# Patient Record
Sex: Male | Born: 1990 | Race: Black or African American | Hispanic: No | Marital: Single | Smoking: Current every day smoker
Health system: Southern US, Community
[De-identification: ages and names within clinical notes are randomized; demographics above are authoritative.]

## PROBLEM LIST (undated history)

## (undated) HISTORY — PX: APPENDECTOMY: SHX54

---
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
## 2015-01-01 ENCOUNTER — Encounter (HOSPITAL_COMMUNITY): Payer: Self-pay | Admitting: Emergency Medicine

## 2015-01-01 ENCOUNTER — Emergency Department (HOSPITAL_COMMUNITY)
Admission: EM | Admit: 2015-01-01 | Discharge: 2015-01-01 | Disposition: A | Discharge status: Home / Self Care | Payer: Self-pay | Attending: Emergency Medicine | Admitting: Emergency Medicine

## 2015-01-01 ENCOUNTER — Emergency Department (HOSPITAL_COMMUNITY)
Admission: EM | Admit: 2015-01-01 | Discharge: 2015-01-01 | Disposition: A | Discharge status: Home / Self Care | Payer: Medicaid Other

## 2015-01-01 DIAGNOSIS — K029 Dental caries, unspecified: Secondary | ICD-10-CM | POA: Insufficient documentation

## 2015-01-01 DIAGNOSIS — K0889 Other specified disorders of teeth and supporting structures: Secondary | ICD-10-CM

## 2015-01-01 DIAGNOSIS — L299 Pruritus, unspecified: Secondary | ICD-10-CM

## 2015-01-01 MED ORDER — PENICILLIN V POTASSIUM 250 MG PO TABS: 250.0000 mg | ORAL_TABLET | Freq: Four times a day (QID) | ORAL | Status: AC

## 2015-01-01 NOTE — ED Notes (Signed)
Pt. reports left upper molar pain and generalized skin itching onset this week , denies SOB / no fever or oral swelling.

## 2015-01-01 NOTE — ED Provider Notes (Signed)
CSN: [id number]     Arrival date & time 01/01/15  2310 History   First MD Initiated Contact with Patient 01/01/15 2316     Chief Complaint  Patient presents with  . Dental Pain     (Consider location/radiation/quality/duration/timing/severity/associated sxs/prior Treatment) HPI Comments: Pt comes in with c/o left upper dental pain. Denies swelling or problems breathing. No fever. States that he has had problems with his teeth in the past. He states that he has also been very itchy for the last couple of days. Denies any rash. He is unsure of any new exposures  The history is provided by the patient. No language interpreter was used.    History reviewed. No pertinent past medical history. Past Surgical History  Procedure Laterality Date  . Appendectomy     No family history on file. Social History  Substance Use Topics  . Smoking status: Never Smoker   . Smokeless tobacco: None  . Alcohol Use: No    Review of Systems  All other systems reviewed and are negative.     Allergies  Review of patient's allergies indicates no known allergies.  Home Medications   Prior to Admission medications   Medication Sig Start Date End Date Taking? Authorizing Provider  penicillin v potassium (VEETID) 250 MG tablet Take 1 tablet (250 mg total) by mouth 4 (four) times daily. 01/01/15 01/08/15  Teressa LowerVrinda Bianey Tesoro, NP   BP 123/86 mmHg  Pulse 94  Temp(Src) 98 F (36.7 C) (Oral)  Resp 16  Ht 5\' 11"  (1.803 m)  Wt 128 lb (58.06 kg)  BMI 17.86 kg/m2  SpO2 96% Physical Exam  Constitutional: He is oriented to person, place, and time. He appears well-developed and well-nourished.  HENT:  Head: Normocephalic and atraumatic.  Right Ear: External ear normal.  Left Ear: External ear normal.  Decay noted to multiple left upper teeth. No swelling noted  Cardiovascular: Normal rate and regular rhythm.   Pulmonary/Chest: Breath sounds normal.  Musculoskeletal: Normal range of motion.   Neurological: He is alert and oriented to person, place, and time. Coordination normal.  Skin: Skin is warm and dry. No rash noted. No erythema.  Nursing note and vitals reviewed.   ED Course  Procedures (including critical care time) Labs Review Labs Reviewed - No data to display  Imaging Review No results found. I have personally reviewed and evaluated these images and lab results as part of my medical decision-making.   EKG Interpretation None      MDM   Final diagnoses:  Toothache  Itching    No sign of ludwigs angina. Pt placed on pcn. Discussed follow up with the dentist   Teressa LowerVrinda Kierah Goatley, NP 01/01/15 2331  Layla MawKristen N Ward, DO 01/02/15 [id number]

## 2015-01-01 NOTE — ED Notes (Signed)
Pt. reports left upper molar pain with generalized skin itching onset this week , respirations unlabored , no oral swelling .

## 2015-01-01 NOTE — Discharge Instructions (Signed)
Pruritus Pruritus is an itching feeling. There are many different conditions and factors that can make your skin itchy. Dry skin is one of the most common causes of itching. Most cases of itching do not require medical attention. Itchy skin can turn into a rash.  HOME CARE INSTRUCTIONS  Watch your pruritus for any changes. Take these steps to help with your condition:  Skin Care  Moisturize your skin as needed. A moisturizer that contains petroleum jelly is best for keeping moisture in your skin.  Take or apply medicines only as directed by your health care provider. This may include:  Corticosteroid cream.  Anti-itch lotions.  Oral anti-histamines.  Apply cool compresses to the affected areas.  Try taking a bath with:  Epsom salts. Follow the instructions on the packaging. You can get these at your local pharmacy or grocery store.  Baking soda. Pour a small amount into the bath as directed by your health care provider.  Colloidal oatmeal. Follow the instructions on the packaging. You can get this at your local pharmacy or grocery store.  Try applying baking soda paste to your skin. Stir water into baking soda until it reaches a paste-like consistency.   Do not scratch your skin.  Avoid hot showers or baths, which can make itching worse. A cold shower may help with itching as long as you use a moisturizer after.  Avoid scented soaps, detergents, and perfumes. Use gentle soaps, detergents, perfumes, and other cosmetic products. General Instructions  Avoid wearing tight clothes.  Keep a journal to help track what causes your itch. Write down:  What you eat.  What cosmetic products you use.  What you drink.  What you wear. This includes jewelry.  Use a humidifier. This keeps the air moist, which helps to prevent dry skin. SEEK MEDICAL CARE IF:  The itching does not go away after several days.  You sweat at night.  You have weight loss.  You are unusually  thirsty.  You urinate more than normal.  You are more tired than normal.  You have abdominal pain.  Your skin tingles.  You feel weak.  Your skin or the whites of your eyes look yellow (jaundice).  Your skin feels numb.   This information is not intended to replace advice given to you by your health care provider. Make sure you discuss any questions you have with your health care provider.   Document Released: 10/11/2010 Document Revised: 06/15/2014 Document Reviewed: 01/25/2014 Elsevier Interactive Patient Education 2016 [employer].  Emergency Department Resource Guide 1) Find a Doctor and Pay Out of Pocket Although you won't have to find out who is covered by your insurance plan, it is a good idea to ask around and get recommendations. You will then need to call the office and see if the doctor you have chosen will accept you as a new patient and what types of options they offer for patients who are self-pay. Some doctors offer discounts or will set up payment plans for their patients who do not have insurance, but you will need to ask so you aren't surprised when you get to your appointment.  2) Contact Your Local Health Department Not all health departments have doctors that can see patients for sick visits, but many do, so it is worth a call to see if yours does. If you don't know where your local health department is, you can check in your phone book. The CDC also has a tool to help you locate  your state's health department, and many state websites also have listings of all of their local health departments.  3) Find a Walk-in Clinic If your illness is not likely to be very severe or complicated, you may want to try a walk in clinic. These are popping up all over the country in pharmacies, drugstores, and shopping centers. They're usually staffed by nurse practitioners or physician assistants that have been trained to treat common illnesses and complaints. They're usually fairly  quick and inexpensive. However, if you have serious medical issues or chronic medical problems, these are probably not your best option.  No Primary Care Doctor: - Call Health Connect at  [phone] - they can help you locate a primary care doctor that  accepts your insurance, provides certain services, etc. - Physician Referral Service- [phone]  Chronic Pain Problems: Organization         Address  Phone   Notes  [facility]  [phone] Patients need to be referred by their primary care doctor.   Medication Assistance: Organization         Address  Phone   Notes  [facility] Medication [facility] [address] [place], [place] [address] [phone] --Must be a resident of [facility] [place] -- Must have NO insurance coverage whatsoever (no Medicaid/ Medicare, etc.) -- The pt. MUST have a primary care doctor that directs their care regularly and follows them in the community   [facility]ist  [phone]   [employer]  [phone]    Agencies that provide inexpensive medical care: Organization         Address  Phone   Notes  [facility]  [phone]   [facility]    [phone]   [facility] [address] [place], [place] [address] [phone]   [facility] [address], [place] [phone]   [facility]    [phone]   [facility]    [phone]   [facility]  [address], [place] Phone:  [phone], Fax:  [phone] Hours of Operation:  9 am - 6 pm, M-F.  Also accepts Medicaid/Medicare and self-pay.  [facility]  [address], [place] Phone: [phone], Fax: [phone]) [phone]. Hours of Operation:  8:30 am - 5:30 pm, M-F.  Also accepts Medicaid and self-pay.  [facility] [place] [address], [place] Phone: [phone]    [facility] [address], [place], [place] [phone], Ext. [phone] Mondays & Thursdays: 7-9 AM.  First 15 patients are seen on a first come, first serve basis.    Medicaid-accepting [facility] Providers:  Organization         Address  Phone   Notes  [facility] [address],  [phone] Also accepts self-pay patients.  [facility] [address] [place], [place]  [phone]   [facility] [address], [place] [phone]) [phone]   [facility] [address] [place] [address], [place] [phone]   Renaye RakersVeita Bland [address], [place]   [phone] Only accepts [place] Access [place] patients after they have their name applied to their card.   Self-Pay (no insurance) in [facility] [place]:  Organization  Address  Phone   Notes  Sickle Cell Patients, [facility] [place] [phone]   [facility] [place] [phone]   [facility] [place]  [address], [place] [phone]   [facility]/Dr. Osei-Bonsu  [address], [place] [address], [place] [phone] Phone number for both [place] and [place] locations is the same.  [facility] [address] [place] [phone]   [facility] [address], [place] or [address] [phone]   [facility] [address] [place] [phone], phone; [phone], fax Sees patients 1st and 3rd Saturday of every month.  Must not qualify for public or private insurance (i.e. Medicaid, Medicare, [place] Health Choice, Veterans' Benefits)  Household income should be no more than 200% of the poverty level The clinic cannot treat you if you are  pregnant or think you are pregnant  Sexually transmitted diseases are not treated at the clinic.    Dental Care: Organization         Address  Phone  Notes  [facility] [place] [phone] Accepts children up to age 56 who are enrolled in [place] or [place]; pregnant women with a Medicaid card; and children who have applied for Medicaid or [place] Health Choice, but were declined, whose parents can pay a reduced fee at time of service.  [facility]  [address], [place] [phone] Accepts children up to age 30 who are enrolled in [place] or [place]; pregnant women with a Medicaid card; and children who have applied for Medicaid or [place] Health Choice, but were declined, whose parents can pay a reduced fee at time of service.  [facility]  [place] [phone] Patients are seen by appointment only. Walk-ins are not accepted. [place] will see patients 32 years of age and older. Monday - Tuesday (8am-5pm) Most Wednesdays (8:30-5pm) $30 per visit, cash only  [facility] Access PRO[facility]  [address], [facility] [phone] Patients are seen by appointment only. Walk-ins are not accepted. [place] will see patients 61 years of age and older. One Wednesday Evening (Monthly: Volunteer Based).  $30 per visit, cash only  [place]  [phone] for adults; Children under age 24, call Graduate Pediatric Dentistry at [phone]. Children aged 39-14, please call [phone] to request a pediatric application.  Dental services are provided in all areas of dental care including fillings, crowns and bridges, complete and partial dentures, implants, gum treatment, root canals, and extractions. Preventive care is also provided. Treatment is provided to  both adults and children. Patients are selected via a lottery and there is often a waiting list.   [facility] [address], [place]  [phone] [email or web address]   [facility] [address] [place], [place] [phone], Ext. [phone] Second and Fourth Thursday of each month, opens at 6:30 AM; Clinic ends at 9 AM.  Patients are seen on a first-come first-served basis, and a limited number are seen during each clinic.   [facility]  [address] [place], [place], [place] [phone]   Eligibility Requirements °You must have lived in [place], [place], or [place] counties for at least the last three months. °  You cannot be eligible for state or federal sponsored healthcare insurance, including Veterans Administration, Medicaid, or Medicare. °  You generally cannot be eligible for healthcare insurance through your employer.  °  How to apply: °Eligibility screenings are held every Tuesday and Wednesday afternoon from 1:00 pm until 4:00 pm. You do not need an appointment for the interview!  °[facility] [address], [place], [place] [phone]   °[facility]  336-342-8273   °[facility]  336-703-3100   °[facility]  336-570-6415   ° °Behavioral Health Resources in the Community: °Intensive Outpatient Programs °Organization         Address  Phone  Notes  °[place] [facility] [address], [place], [place] [phone]   °[facility] [address], [place], [place] [phone]   °[facility] [address], [place], [place] ° 336-882-2125   °[facility] [address],  °[place], [place] 1-800-853-5163 or 336-641-4981   °Substance Abuse Resources °Organization         Address  Phone  Notes  °[facility]  336-882-2125   °[facility]  336-784-9470   °[facility]   336-285-9073   °[facility]  336-845-3988   °Residential & Outpatient Substance Abuse Program  1-800-659-3381   °Psychological Services °Organization         Address  Phone  Notes  °[facility]  336- 832-9600   °[facility]  336- 378-7881   °[facility] [address], [place] 1-800-853-5163 or 336-641-4981   ° °Mobile Crisis Teams °Organization         Address  Phone  Notes  °[facility], Mobile Crisis Care Unit  1-877-626-1772   °Assertive °[facility]otherapeutic Services ° [address] [place], [place] 336-834-9664   °Sharon DeEsch [address] °[place] 336-554-5454   ° °Self-Help/Support Groups °Organization         Address  Phone             Notes  °[facility] [place] - variety of support groups  336- 373-1402 Call for more information  °Narcotics Anonymous (NA), [facility] [address], °[place]  2 meetings at this location  ° °Residential Treatment Programs °Organization         Address  Phone  Notes  °[facility] [address],    °[place]  1-866-801-8205   °[facility] ° [address], [place], [place] [phone]   °[facility] [address], [place] 336-845-3988 Admissions: 8am-3pm M-F  °[facility] [address]    °[place], [place] 336-841-1104   °[facility] [address], [place], [place] 336-379-7146   °[facility] [address]  °[place], [place] 336-285-9073   °[facility] - Intensive Outpatient [address], [place], [place] 336-852-3033   °[facility] ([facility] [address]  °[place], [place] 1-877-615-2722 or 336-784-9470   °[facility] ([facility]) [address] [place], [place] 336-227-7417 Accepts Medicaid  °[facility] [address]  °[place]  1-800-659-3381 Substance Abuse/Addiction Treatment  ° °[place] Behavioral Health Resources °Organization           Address  Phone  Notes  °[facility]  (888) 581-9988   °Julie Brannon, PhD [address] [place], [place]   (336) 349-5553 or (336) 951-0000   ° Behavioral   [address] °[place], [employer] [place] (336) 349-4454   °[facility] [address], [place], [place] (336) 342-8316 Insurance/Medicaid/sponsorship through [facility]  °[facility] [address]                                    [place], [place] (336) 342-8316 Therapy/tele-psych/case  °[facility] [address]  ° [place], [place] (336) 349-2233    °Dr. Arfeen  (336) 349-4544   °[facility] [place]  [employer] [facility] [facility] 1) [address],  °2) [address], [place] °3)  [address], [place] (336) 349-3220 °(336) 342-7768 ° °(336) 342-8140   °[place] Child Abuse Hotline (336) 342-1394 or (336) 342-3537 (After Hours)    ° ° ° ° °

## 2015-01-25 ENCOUNTER — Emergency Department (HOSPITAL_COMMUNITY): Payer: Medicaid Other

## 2015-01-25 ENCOUNTER — Encounter (HOSPITAL_COMMUNITY): Payer: Self-pay | Admitting: Emergency Medicine

## 2015-01-25 ENCOUNTER — Emergency Department (HOSPITAL_COMMUNITY)
Admission: EM | Admit: 2015-01-25 | Discharge: 2015-01-25 | Disposition: A | Discharge status: Home / Self Care | Payer: Medicaid Other | Attending: Emergency Medicine | Admitting: Emergency Medicine

## 2015-01-25 DIAGNOSIS — F17200 Nicotine dependence, unspecified, uncomplicated: Secondary | ICD-10-CM | POA: Insufficient documentation

## 2015-01-25 DIAGNOSIS — R05 Cough: Secondary | ICD-10-CM

## 2015-01-25 DIAGNOSIS — J069 Acute upper respiratory infection, unspecified: Secondary | ICD-10-CM | POA: Insufficient documentation

## 2015-01-25 DIAGNOSIS — R059 Cough, unspecified: Secondary | ICD-10-CM

## 2015-01-25 DIAGNOSIS — Z8611 Personal history of tuberculosis: Secondary | ICD-10-CM | POA: Insufficient documentation

## 2015-01-25 MED ORDER — BENZONATATE 100 MG PO CAPS: 100.0000 mg | ORAL_CAPSULE | Freq: Three times a day (TID) | ORAL | Status: DC

## 2015-01-25 NOTE — ED Provider Notes (Signed)
CSN: [id number]     Arrival date & time 01/25/15  0945 History  This chart was scribed for non-physician practitioner, Ailene Rud, PA-C, working with No att. providers found by Marica Otter, ED Scribe. This patient was seen in room TR06C/TR06C and the patient's care was started at 11:38 AM.   Chief Complaint  Patient presents with  . Cough   The history is provided by the patient. No language interpreter was used.   PCP: No primary care provider on file. HPI Comments: Christian Tanner is a 24 y.o. 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 He states that the cough is gradually improving but he is concerned about lung cancer since he is a daily smoker. He reports he still smokes daily despite his cough. Pt reports taking Theraflu and Nyquil without relief. Pt denies fever, chills recent weight loss, night sweats, headache, congestion, rhinorrhea, sore throat, chest pain, SOB, abdominal pain, n/v, myalgias or any other Sx at this time. He also notes having a positive TB skin test in 2011 for which he was supposed to take 9 months of abx therapy which he did not complete. He was born in [place]  History reviewed. No pertinent past medical history. Past Surgical History  Procedure Laterality Date  . Appendectomy     No family history on file. Social History  Substance Use Topics  . Smoking status: Current Every Day Smoker  . Smokeless tobacco: None  . Alcohol Use: No    Review of Systems  Constitutional: Negative for fever, chills, appetite change, fatigue and unexpected weight change.  HENT: Negative for congestion, ear pain, rhinorrhea, sinus pressure and sore throat.   Eyes: Negative for discharge.  Respiratory: Positive for cough. Negative for shortness of breath and wheezing.   Cardiovascular: Negative for chest pain.   Gastrointestinal: Negative for nausea, vomiting and abdominal pain.  Neurological: Negative for headaches.  All other systems reviewed and are negative.  Allergies  Review of patient's allergies indicates no known allergies.  Home Medications   Prior to Admission medications   Medication Sig Start Date End Date Taking? Authorizing Provider  benzonatate (TESSALON) 100 MG capsule Take 1 capsule (100 mg total) by mouth every 8 (eight) hours. 01/25/15   Shakeeta Godette, PA-C  DiphenhydrAMINE HCl (BENADRYL PO) Take 1 tablet by mouth every 4 (four) hours as needed. For itching    Historical Provider, MD  predniSONE (DELTASONE) 10 MG tablet Take 2 tablets (20 mg total) by mouth 2 (two) times daily. 04/05/11   Marlon Pel, PA-C   Triage Vitals: BP 118/73 mmHg  Pulse 75  Temp(Src) 97.7 F (36.5 C) (Oral)  Resp 16  Ht  (1.778 m)  Wt 195 lb (88.451 kg)  BMI 27.98 kg/m2  SpO2 100% Physical Exam  Constitutional: He appears well-developed and well-nourished. No distress.  HENT:  Head: Normocephalic and atraumatic.  Right Ear: Tympanic membrane, external ear and ear canal normal.  Left Ear: Tympanic membrane, external ear and ear canal normal.  Nose: No mucosal edema or rhinorrhea. Right sinus exhibits no maxillary sinus tenderness and no frontal sinus tenderness. Left sinus exhibits no maxillary sinus tenderness and no frontal sinus tenderness.  Mouth/Throat: Uvula is midline and oropharynx is clear and moist. No oropharyngeal exudate, posterior oropharyngeal edema or posterior oropharyngeal erythema.  Eyes: Conjunctivae and EOM are normal. Right eye exhibits no  discharge. Left eye exhibits no discharge. No scleral icterus.  Neck: Normal range of motion. Neck supple.  Cardiovascular: Normal rate and normal heart sounds.   Pulmonary/Chest: Effort normal and breath sounds normal. No respiratory distress. He has no wheezes. He has no rales.  Breathing unlabored. Lungs CTAB   Musculoskeletal: Normal range of motion.  Moves all extremities spontaneously and walks with a steady gait  Lymphadenopathy:    He has no cervical adenopathy.  Neurological: He is alert. Coordination normal.  Skin: Skin is warm and dry. He is not diaphoretic.  Psychiatric: He has a normal mood and affect. His behavior is normal.  Nursing note and vitals reviewed.   ED Course  Procedures (including critical care time) DIAGNOSTIC STUDIES: Oxygen Saturation is 100% on ra, nl by my interpretation.    COORDINATION OF CARE: 10:41 AM: Discussed treatment plan which includes cough suppressant, PCP referral, with pt at bedside; patient verbalizes understanding and agrees with treatment plan.  MDM   Final diagnoses:  Cough  Upper respiratory infection   Patient presenting with dry cough x 3 weeks. VSS. Pt is nontoxic appearing. No nasal musosal edema noted. TMs pearly gray without erythema or effusion. Oropharynx without erythema, edema or exudate. Lungs CTAB. CXR negative for acute infiltrate. Patients symptoms are consistent with bronchitis, likely viral etiology. Discussed that antibiotics are not indicated for viral infections. Pt will be discharged with symptomatic treatment including tessalon. Also counseled patient on smoking cessation. Discussed patient with infectious disease due to TB history. They recommend follow up with the health department. Discussed this with pt who verbalizes understanding and is agreeable with plan. Pt is hemodynamically stable & in NAD prior to dc.  I personally performed the services described in this documentation, which was scribed in my presence. The recorded information has been reviewed and is accurate.    Alveta HeimlichStevi Kameryn Tisdel, PA-C 01/25/15 1156  Tilden FossaElizabeth Rees, MD 01/26/15 [id number]

## 2015-01-25 NOTE — ED Notes (Signed)
COUGH X 4 WEEKS. DESCRIBES AS 'DRY'. STATES HAD POS TINE TEST IN 2011, WAS PLACED ON MEDS, TOOK FOR 1 MONTH THEN HAD TO GO OUT OF THE COUN[place]. NEVER HAD F/U.

## 2015-01-25 NOTE — Discharge Instructions (Signed)
Call the health department to schedule an appointment for a TB follow up. Continue taking OTC cold and flu medications for your symptoms. Tessalon will help with your cough.    Cough, Adult Coughing is a reflex that clears your throat and your airways. Coughing helps to heal and protect your lungs. It is normal to cough occasionally, but a cough that happens with other symptoms or lasts a long time may be a sign of a condition that needs treatment. A cough may last only 2-3 weeks (acute), or it may last longer than 8 weeks (chronic). CAUSES Coughing is commonly caused by:  Breathing in substances that irritate your lungs.  A viral or bacterial respiratory infection.  Allergies.  Asthma.  Postnasal drip.  Smoking.  Acid backing up from the stomach into the esophagus (gastroesophageal reflux).  Certain medicines.  Chronic lung problems, including COPD (or rarely, lung cancer).  Other medical conditions such as heart failure. HOME CARE INSTRUCTIONS  Pay attention to any changes in your symptoms. Take these actions to help with your discomfort:  Take medicines only as told by your health care provider.  If you were prescribed an antibiotic medicine, take it as told by your health care provider. Do not stop taking the antibiotic even if you start to feel better.  Talk with your health care provider before you take a cough suppressant medicine.  Drink enough fluid to keep your urine clear or pale yellow.  If the air is dry, use a cold steam vaporizer or humidifier in your bedroom or your home to help loosen secretions.  Avoid anything that causes you to cough at work or at home.  If your cough is worse at night, try sleeping in a semi-upright position.  Avoid cigarette smoke. If you smoke, quit smoking. If you need help quitting, ask your health care provider.  Avoid caffeine.  Avoid alcohol.  Rest as needed. SEEK MEDICAL CARE IF:   You have new symptoms.  You cough  up pus.  Your cough does not get better after 2-3 weeks, or your cough gets worse.  You cannot control your cough with suppressant medicines and you are losing sleep.  You develop pain that is getting worse or pain that is not controlled with pain medicines.  You have a fever.  You have unexplained weight loss.  You have night sweats. SEEK IMMEDIATE MEDICAL CARE IF:  You cough up blood.  You have difficulty breathing.  Your heartbeat is very fast.   This information is not intended to replace advice given to you by your health care provider. Make sure you discuss any questions you have with your health care provider.   Document Released: 07/28/2010 Document Revised: 10/20/2014 Document Reviewed: 04/07/2014 Elsevier Interactive Patient Education 2016 [employer]  Upper Respiratory Infection, Adult Most upper respiratory infections (URIs) are a viral infection of the air passages leading to the lungs. A URI affects the nose, throat, and upper air passages. The most common type of URI is nasopharyngitis and is typically referred to as "the common cold." URIs run their course and usually go away on their own. Most of the time, a URI does not require medical attention, but sometimes a bacterial infection in the upper airways can follow a viral infection. This is called a secondary infection. Sinus and middle ear infections are common types of secondary upper respiratory infections. Bacterial pneumonia can also complicate a URI. A URI can worsen asthma and chronic obstructive pulmonary disease (COPD). Sometimes, these  complications can require emergency medical care and may be life threatening.  CAUSES Almost all URIs are caused by viruses. A virus is a type of germ and can spread from one person to another.  RISKS FACTORS You may be at risk for a URI if:   You smoke.   You have chronic heart or lung disease.  You have a weakened defense (immune) system.   You are very young  or very old.   You have nasal allergies or asthma.  You work in crowded or poorly ventilated areas.  You work in health care facilities or schools. SIGNS AND SYMPTOMS  Symptoms typically develop 2-3 days after you come in contact with a cold virus. Most viral URIs last 7-10 days. However, viral URIs from the influenza virus (flu virus) can last 14-18 days and are typically more severe. Symptoms may include:   Runny or stuffy (congested) nose.   Sneezing.   Cough.   Sore throat.   Headache.   Fatigue.   Fever.   Loss of appetite.   Pain in your forehead, behind your eyes, and over your cheekbones (sinus pain).  Muscle aches.  DIAGNOSIS  Your health care provider may diagnose a URI by:  Physical exam.  Tests to check that your symptoms are not due to another condition such as:  Strep throat.  Sinusitis.  Pneumonia.  Asthma. TREATMENT  A URI goes away on its own with time. It cannot be cured with medicines, but medicines may be prescribed or recommended to relieve symptoms. Medicines may help:  Reduce your fever.  Reduce your cough.  Relieve nasal congestion. HOME CARE INSTRUCTIONS   Take medicines only as directed by your health care provider.   Gargle warm saltwater or take cough drops to comfort your throat as directed by your health care provider.  Use a warm mist humidifier or inhale steam from a shower to increase air moisture. This may make it easier to breathe.  Drink enough fluid to keep your urine clear or pale yellow.   Eat soups and other clear broths and maintain good nutrition.   Rest as needed.   Return to work when your temperature has returned to normal or as your health care provider advises. You may need to stay home longer to avoid infecting others. You can also use a face mask and careful hand washing to prevent spread of the virus.  Increase the usage of your inhaler if you have asthma.   Do not use any tobacco  products, including cigarettes, chewing tobacco, or electronic cigarettes. If you need help quitting, ask your health care provider. PREVENTION  The best way to protect yourself from getting a cold is to practice good hygiene.   Avoid oral or hand contact with people with cold symptoms.   Wash your hands often if contact occurs.  There is no clear evidence that vitamin C, vitamin E, echinacea, or exercise reduces the chance of developing a cold. However, it is always recommended to get plenty of rest, exercise, and practice good nutrition.  SEEK MEDICAL CARE IF:   You are getting worse rather than better.   Your symptoms are not controlled by medicine.   You have chills.  You have worsening shortness of breath.  You have brown or red mucus.  You have yellow or brown nasal discharge.  You have pain in your face, especially when you bend forward.  You have a fever.  You have swollen neck glands.  You have  pain while swallowing.  You have white areas in the back of your throat. SEEK IMMEDIATE MEDICAL CARE IF:   You have severe or persistent:  Headache.  Ear pain.  Sinus pain.  Chest pain.  You have chronic lung disease and any of the following:  Wheezing.  Prolonged cough.  Coughing up blood.  A change in your usual mucus.  You have a stiff neck.  You have changes in your:  Vision.  Hearing.  Thinking.  Mood. MAKE SURE YOU:   Understand these instructions.  Will watch your condition.  Will get help right away if you are not doing well or get worse.   This information is not intended to replace advice given to you by your health care provider. Make sure you discuss any questions you have with your health care provider.   Document Released: 07/25/2000 Document Revised: 06/15/2014 Document Reviewed: 05/06/2013 Elsevier Interactive Patient Education [employer]

## 2015-08-02 ENCOUNTER — Encounter (HOSPITAL_COMMUNITY): Payer: Self-pay | Admitting: Emergency Medicine

## 2016-09-28 ENCOUNTER — Encounter (HOSPITAL_COMMUNITY): Payer: Self-pay | Admitting: Nurse Practitioner

## 2016-09-28 ENCOUNTER — Emergency Department (HOSPITAL_COMMUNITY)
Admission: EM | Admit: 2016-09-28 | Discharge: 2016-09-28 | Disposition: A | Discharge status: Home / Self Care | Payer: Medicaid Other | Attending: Emergency Medicine | Admitting: Emergency Medicine

## 2016-09-28 DIAGNOSIS — F17200 Nicotine dependence, unspecified, uncomplicated: Secondary | ICD-10-CM | POA: Insufficient documentation

## 2016-09-28 DIAGNOSIS — K0889 Other specified disorders of teeth and supporting structures: Secondary | ICD-10-CM

## 2016-09-28 DIAGNOSIS — K047 Periapical abscess without sinus: Secondary | ICD-10-CM | POA: Insufficient documentation

## 2016-09-28 MED ORDER — NAPROXEN 250 MG PO TABS
500.0000 mg | ORAL_TABLET | Freq: Once | ORAL | Status: AC
  Administered 2016-09-28: 500 mg via ORAL
  Filled 2016-09-28: qty 2

## 2016-09-28 MED ORDER — PENICILLIN V POTASSIUM 500 MG PO TABS: 500.0000 mg | ORAL_TABLET | Freq: Four times a day (QID) | ORAL | 0 refills | Status: DC

## 2016-09-28 MED ORDER — NAPROXEN 500 MG PO TABS: 500.0000 mg | ORAL_TABLET | Freq: Two times a day (BID) | ORAL | 0 refills | Status: DC

## 2016-09-28 MED ORDER — PENICILLIN V POTASSIUM 250 MG PO TABS
500.0000 mg | ORAL_TABLET | Freq: Once | ORAL | Status: AC
  Administered 2016-09-28: 500 mg via ORAL
  Filled 2016-09-28: qty 2

## 2016-09-28 NOTE — Discharge Instructions (Signed)
Take the prescribed medication as directed. Follow-up with dentist as soon as you can.  No dentist on call today, can try to follow-up with one of the local clinics.  See attached page for contact information. Return to the ED for new or worsening symptoms.

## 2016-09-28 NOTE — ED Triage Notes (Signed)
Pt presents with c/o dental infection. He reports a three day history of increasing pain and swelling to his left jaw. He has been taking ibuprofen with temporary relief.

## 2016-09-28 NOTE — ED Provider Notes (Signed)
[facility]-EMERGENCY DEPT Provider Note   CSN: [id number] Arrival date & time: 09/28/16  1531     History   Chief Complaint Chief Complaint  Patient presents with  . Dental Pain    HPI Christian Tanner is a 26 y.o. male.  The history is provided by the patient and medical records.  Dental Pain       26 year old male with no significant past medical history presenting to the ED for left lower dental pain. Patient reports this began about 3 days ago, has been progressively worsening. States over the past 2 days, swelling has become more severe. He reports difficulty chewing on the affected side because of this. No difficulty swallowing or shortness of breath. Denies any fever or chills. States he has been taking 800 mg Motrin tablets his mother has been giving him without relief. He does not currently have a dentist.  No past medical history on file.  There are no active problems to display for this patient.   Past Surgical History:  Procedure Laterality Date  . APPENDECTOMY         Home Medications    Prior to Admission medications   Medication Sig Start Date End Date Taking? Authorizing Provider  benzonatate (TESSALON) 100 MG capsule Take 1 capsule (100 mg total) by mouth every 8 (eight) hours. 01/25/15   Barrett, Rolm Gala, PA-C  DiphenhydrAMINE HCl (BENADRYL PO) Take 1 tablet by mouth every 4 (four) hours as needed. For itching    [provider]  predniSONE (DELTASONE) 10 MG tablet Take 2 tablets (20 mg total) by mouth 2 (two) times daily. 04/05/11   Marlon Pel, PA-C    Family History No family history on file.  Social History Social History  Substance Use Topics  . Smoking status: Current Every Day Smoker  . Smokeless tobacco: Current User    Types: Chew  . Alcohol use No     Allergies   Patient has no known allergies.   Review of Systems Review of Systems  HENT: Positive for dental problem.   All other systems reviewed and are  negative.    Physical Exam Updated Vital Signs BP 104/69   Pulse 81   Temp 98 F (36.7 C) (Oral)   Resp 17   SpO2 100%   Physical Exam  Constitutional: He is oriented to person, place, and time. He appears well-developed and well-nourished.  HENT:  Head: Normocephalic and atraumatic.  Mouth/Throat: Oropharynx is clear and moist.  Teeth largely in poor dentition, left lower molars are decayed with minor defects noted, surrounding gingiva swollen with extension into left lower cheek/jaw, handling secretions appropriately, no trismus, no neck swelling, normal phonation without stridor  Eyes: Pupils are equal, round, and reactive to light. Conjunctivae and EOM are normal.  Neck: Normal range of motion.  Cardiovascular: Normal rate, regular rhythm and normal heart sounds.   Pulmonary/Chest: Effort normal and breath sounds normal.  Abdominal: Soft. Bowel sounds are normal.  Musculoskeletal: Normal range of motion.  Neurological: He is alert and oriented to person, place, and time.  Skin: Skin is warm and dry.  Psychiatric: He has a normal mood and affect.  Nursing note and vitals reviewed.    ED Treatments / Results  Labs (all labs ordered are listed, but only abnormal results are displayed) Labs Reviewed - No data to display  EKG  EKG Interpretation None       Radiology No results found.  Procedures Procedures (including critical care time)  Medications Ordered  in ED Medications  penicillin v potassium (VEETID) tablet 500 mg (not administered)  naproxen (NAPROSYN) tablet 500 mg (not administered)     Initial Impression / Assessment and Plan / ED Course  I have reviewed the triage vital signs and the nursing notes.  Pertinent labs & imaging results that were available during my care of the patient were reviewed by me and considered in my medical decision making (see chart for details).  26 year old male here with left lower dental pain for the past 3 days.  Reports swelling worsening over the past 2 days. On exam he is afebrile and nontoxic. Does have some swelling of the left lower cheek and jaw without extension into the neck. He is handling his secretions well. Normal phonation without stridor. No signs or symptoms concerning for Ludwig's angina. Will start on antibiotics, referred to dentist for follow-up. Given information for local clinics as no dentist on call today.  Discussed plan with patient, he acknowledged understanding and agreed with plan of care.  Return precautions given for new or worsening symptoms.  Final Clinical Impressions(s) / ED Diagnoses   Final diagnoses:  Pain, dental  Dental abscess    New Prescriptions New Prescriptions   NAPROXEN (NAPROSYN) 500 MG TABLET    Take 1 tablet (500 mg total) by mouth 2 (two) times daily with a meal.   PENICILLIN V POTASSIUM (VEETID) 500 MG TABLET    Take 1 tablet (500 mg total) by mouth 4 (four) times daily.     Garlon Hatchet, PA-C 09/28/16 1650    Little, Ambrose Finland, MD 09/29/16 2106

## 2018-08-20 ENCOUNTER — Emergency Department (HOSPITAL_COMMUNITY)
Admission: EM | Admit: 2018-08-20 | Discharge: 2018-08-20 | Disposition: A | Discharge status: Home / Self Care | Payer: BLUE CROSS/BLUE SHIELD | Attending: Emergency Medicine | Admitting: Emergency Medicine

## 2018-08-20 ENCOUNTER — Other Ambulatory Visit: Payer: Self-pay

## 2018-08-20 ENCOUNTER — Emergency Department (HOSPITAL_COMMUNITY): Payer: BLUE CROSS/BLUE SHIELD

## 2018-08-20 DIAGNOSIS — F17220 Nicotine dependence, chewing tobacco, uncomplicated: Secondary | ICD-10-CM | POA: Insufficient documentation

## 2018-08-20 DIAGNOSIS — Z79899 Other long term (current) drug therapy: Secondary | ICD-10-CM | POA: Diagnosis not present

## 2018-08-20 DIAGNOSIS — W458XXA Other foreign body or object entering through skin, initial encounter: Secondary | ICD-10-CM | POA: Diagnosis not present

## 2018-08-20 DIAGNOSIS — Y92009 Unspecified place in unspecified non-institutional (private) residence as the place of occurrence of the external cause: Secondary | ICD-10-CM | POA: Diagnosis not present

## 2018-08-20 DIAGNOSIS — Z23 Encounter for immunization: Secondary | ICD-10-CM | POA: Insufficient documentation

## 2018-08-20 DIAGNOSIS — Y9389 Activity, other specified: Secondary | ICD-10-CM | POA: Diagnosis not present

## 2018-08-20 DIAGNOSIS — Z1889 Other specified retained foreign body fragments: Secondary | ICD-10-CM | POA: Diagnosis not present

## 2018-08-20 DIAGNOSIS — S91342A Puncture wound with foreign body, left foot, initial encounter: Secondary | ICD-10-CM | POA: Diagnosis not present

## 2018-08-20 DIAGNOSIS — S91332A Puncture wound without foreign body, left foot, initial encounter: Secondary | ICD-10-CM

## 2018-08-20 DIAGNOSIS — Y999 Unspecified external cause status: Secondary | ICD-10-CM | POA: Diagnosis not present

## 2018-08-20 DIAGNOSIS — S99922A Unspecified injury of left foot, initial encounter: Secondary | ICD-10-CM | POA: Diagnosis present

## 2018-08-20 MED ORDER — LIDOCAINE-EPINEPHRINE (PF) 2 %-1:200000 IJ SOLN
20.0000 mL | Freq: Once | INTRAMUSCULAR | Status: AC
  Administered 2018-08-20: 20 mL
  Filled 2018-08-20: qty 20

## 2018-08-20 MED ORDER — HYDROCODONE-ACETAMINOPHEN 5-325 MG PO TABS
1.0000 | ORAL_TABLET | Freq: Once | ORAL | Status: AC
  Administered 2018-08-20: 1 via ORAL
  Filled 2018-08-20: qty 1

## 2018-08-20 MED ORDER — TETANUS-DIPHTH-ACELL PERTUSSIS 5-2.5-18.5 LF-MCG/0.5 IM SUSP
0.5000 mL | Freq: Once | INTRAMUSCULAR | Status: AC
  Administered 2018-08-20: 0.5 mL via INTRAMUSCULAR
  Filled 2018-08-20: qty 0.5

## 2018-08-20 MED ORDER — CEPHALEXIN 500 MG PO CAPS: 500.0000 mg | ORAL_CAPSULE | Freq: Four times a day (QID) | ORAL | 0 refills | Status: DC

## 2018-08-20 NOTE — ED Notes (Signed)
Patient transported to X-ray 

## 2018-08-20 NOTE — ED Notes (Signed)
Patient verbalized understanding of dc instructions, vss, ambulatory with nad.   

## 2018-08-20 NOTE — ED Triage Notes (Signed)
Pt states that about 10 min ago he stepped on a screw ; pt attempted to take it out but was not successful ; pt unsure how long screw is but could be maybe 2 inches; no bleeding at this time

## 2018-08-20 NOTE — ED Provider Notes (Signed)
[facility] EMERGENCY DEPARTMENT Provider Note   CSN: [id number] Arrival date & time: 08/20/18  1837    History   Chief Complaint Chief Complaint  Patient presents with  . Puncture wound    HPI Christian Tanner is a 28 y.o. male.     Christian Tanner is a 28 y.o. male with history of appendectomy, otherwise healthy, who presents to the ED for evaluation of screw stuck in the bottom of the left foot.  He reports about 10 minutes prior to arrival he stepped on a screw in his house from where he was doing work yesterday.  He reports he attempted to remove the screw from the bottom of his foot but was on successful and is unsure how long the screw was or how deep it is in his foot.  He reports severe pain in this area.  No numbness or tingling.  He has not noted any bleeding.  He is unsure of his last tetanus vaccine.  Nothing for pain prior to arrival.     No past medical history on file.  There are no active problems to display for this patient.   Past Surgical History:  Procedure Laterality Date  . APPENDECTOMY          Home Medications    Prior to Admission medications   Medication Sig Start Date End Date Taking? Authorizing Provider  benzonatate (TESSALON) 100 MG capsule Take 1 capsule (100 mg total) by mouth every 8 (eight) hours. 01/25/15   Barrett, Lahoma Crocker, PA-C  cephALEXin (KEFLEX) 500 MG capsule Take 1 capsule (500 mg total) by mouth 4 (four) times daily. 08/20/18   Jacqlyn Larsen, PA-C  DiphenhydrAMINE HCl (BENADRYL PO) Take 1 tablet by mouth every 4 (four) hours as needed. For itching    [provider]  naproxen (NAPROSYN) 500 MG tablet Take 1 tablet (500 mg total) by mouth 2 (two) times daily with a meal. 09/28/16   Larene Pickett, PA-C  penicillin v potassium (VEETID) 500 MG tablet Take 1 tablet (500 mg total) by mouth 4 (four) times daily. 09/28/16   Larene Pickett, PA-C  predniSONE (DELTASONE) 10 MG tablet Take 2 tablets (20 mg total) by mouth 2  (two) times daily. 04/05/11   Delos Haring, PA-C    Family History No family history on file.  Social History Social History   Tobacco Use  . Smoking status: Current Every Day Smoker  . Smokeless tobacco: Current User    Types: Chew  Substance Use Topics  . Alcohol use: No  . Drug use: No     Allergies   Patient has no known allergies.   Review of Systems Review of Systems  Constitutional: Negative for chills and fever.  Musculoskeletal: Positive for arthralgias.  Skin: Positive for wound. Negative for color change.  Neurological: Negative for weakness and numbness.     Physical Exam Updated Vital Signs BP 129/82   Pulse 94   Temp 98.4 F (36.9 C) (Oral)   Resp 18   SpO2 99%   Physical Exam Vitals signs and nursing note reviewed.  Constitutional:      General: He is not in acute distress.    Appearance: Normal appearance. He is well-developed and normal weight. He is not diaphoretic.  HENT:     Head: Normocephalic and atraumatic.  Eyes:     General:        Right eye: No discharge.        Left eye:  No discharge.  Pulmonary:     Effort: Pulmonary effort is normal. No respiratory distress.  Musculoskeletal:       Feet:     Comments: Screw in the bottom of the left foot, just distal to the heel Normal sensation and strength, 2+ pulse and good capillary refill.  Skin:    General: Skin is warm and dry.     Capillary Refill: Capillary refill takes less than 2 seconds.  Neurological:     Mental Status: He is alert.     Coordination: Coordination normal.  Psychiatric:        Mood and Affect: Mood normal.        Behavior: Behavior normal.      ED Treatments / Results  Labs (all labs ordered are listed, but only abnormal results are displayed) Labs Reviewed - No data to display  EKG None  Radiology Dg Foot Complete Left  Result Date: 08/20/2018 CLINICAL DATA:  Patient stepped on a screw.  Midfoot puncture. EXAM: LEFT FOOT - COMPLETE 3+ VIEW  COMPARISON:  None. FINDINGS: Intact screw extends into the superficial soft tissues of the mid plantar foot. No fracture or bone lesion. Joints are normally spaced and aligned.  No arthropathic changes. No other soft tissue abnormality. IMPRESSION: Intact screw within the midfoot plantar soft tissues. No fracture or dislocation. Electronically Signed   By: Amie Portlandavid  Ormond M.D.   On: 08/20/2018 20:08    Procedures Procedures (including critical care time)  Medications Ordered in ED Medications  Tdap (BOOSTRIX) injection 0.5 mL (0.5 mLs Intramuscular Given 08/20/18 1947)  HYDROcodone-acetaminophen (NORCO/VICODIN) 5-325 MG per tablet 1 tablet (1 tablet Oral Given 08/20/18 1947)  lidocaine-EPINEPHrine (XYLOCAINE W/EPI) 2 %-1:200000 (PF) injection 20 mL (20 mLs Infiltration Given 08/20/18 1948)     Initial Impression / Assessment and Plan / ED Course  I have reviewed the triage vital signs and the nursing notes.  Pertinent labs & imaging results that were available during my care of the patient were reviewed by me and considered in my medical decision making (see chart for details).  Screw embedded in the left foot.  X-ray shows that the screw is only affecting subcutaneous tissues and only appears to be in the foot for about 2 rungs.  No evidence of bony involvement.  The area was anesthetized and the screw was removed without difficulty.  Tetanus vaccine updated.  Given puncture wound will place patient on Keflex for infection prophylaxis.  Patient was barefoot during injury, no need for pseudomonal coverage.  Infection return precautions provided.  Patient discharged home in good condition.  Final Clinical Impressions(s) / ED Diagnoses   Final diagnoses:  Puncture wound of left foot, initial encounter    ED Discharge Orders         Ordered    cephALEXin (KEFLEX) 500 MG capsule  4 times daily     08/20/18 2052           Dartha LodgeFord, Athleen Feltner N, [place] JerseyPA-C 08/20/18 2059    Melene PlanFloyd, Dan, DO 08/20/18 2322

## 2018-08-20 NOTE — Discharge Instructions (Signed)
Please take antibiotics as directed.  Keep area clean dry and intact, covered with bandage and antibiotic ointment.  You may shower but avoid submerging the foot underwater, no swimming.  If you notice any signs of infection such as redness, swelling, drainage or increasing pain please return for reevaluation.

## 2019-06-26 ENCOUNTER — Ambulatory Visit: Payer: Self-pay | Admitting: Registered Nurse

## 2019-07-01 ENCOUNTER — Encounter: Payer: Self-pay | Admitting: Registered Nurse

## 2019-07-01 ENCOUNTER — Ambulatory Visit (INDEPENDENT_AMBULATORY_CARE_PROVIDER_SITE_OTHER): Payer: 59 | Admitting: Registered Nurse

## 2019-07-01 ENCOUNTER — Other Ambulatory Visit: Payer: Self-pay

## 2019-07-01 VITALS — BP 122/65 | HR 78 | Temp 97.9°F | Resp 17 | Ht 70.0 in | Wt 195.0 lb

## 2019-07-01 DIAGNOSIS — Z0000 Encounter for general adult medical examination without abnormal findings: Secondary | ICD-10-CM | POA: Diagnosis not present

## 2019-07-01 DIAGNOSIS — Z1329 Encounter for screening for other suspected endocrine disorder: Secondary | ICD-10-CM | POA: Diagnosis not present

## 2019-07-01 DIAGNOSIS — Z13228 Encounter for screening for other metabolic disorders: Secondary | ICD-10-CM

## 2019-07-01 DIAGNOSIS — Z13220 Encounter for screening for lipoid disorders: Secondary | ICD-10-CM | POA: Diagnosis not present

## 2019-07-01 DIAGNOSIS — Z130 Encounter for screening for diseases of the blood and blood-forming organs and certain disorders involving the immune mechanism: Secondary | ICD-10-CM

## 2019-07-01 NOTE — Patient Instructions (Signed)
° ° ° °  If you have lab work done today you will be contacted with your lab results within the next 2 weeks.  If you have not heard from us then please contact us. The fastest way to get your results is to register for My Chart. ° ° °IF you received an x-ray today, you will receive an invoice from [facility]. Please contact [facility] at [phone] with questions or concerns regarding your invoice.  ° °IF you received labwork today, you will receive an invoice from [facility]. Please contact [facility] at [phone] with questions or concerns regarding your invoice.  ° °Our billing staff will not be able to assist you with questions regarding bills from these companies. ° °You will be contacted with the lab results as soon as they are available. The fastest way to get your results is to activate your My Chart account. Instructions are located on the last page of this paperwork. If you have not heard from us regarding the results in 2 weeks, please contact this office. °  ° ° ° °

## 2019-07-02 ENCOUNTER — Encounter: Payer: Self-pay | Admitting: Registered Nurse

## 2019-07-02 LAB — COMPREHENSIVE METABOLIC PANEL
ALT: 13 IU/L (ref 0–44)
AST: 17 IU/L (ref 0–40)
Albumin/Globulin Ratio: 1.7 (ref 1.2–2.2)
Albumin: 4.5 g/dL (ref 4.1–5.2)
Alkaline Phosphatase: 100 IU/L (ref 48–121)
BUN/Creatinine Ratio: 9 (ref 9–20)
BUN: 9 mg/dL (ref 6–20)
Bilirubin Total: <0.2 mg/dL (ref 0.0–1.2)
CO2: 23 mmol/L (ref 20–29)
Calcium: 10 mg/dL (ref 8.7–10.2)
Chloride: 103 mmol/L (ref 96–106)
Creatinine, Ser: 1 mg/dL (ref 0.76–1.27)
GFR calc Af Amer: 120 mL/min/{1.73_m2} (ref >59)
GFR calc non Af Amer: 103 mL/min/{1.73_m2} (ref >59)
Globulin, Total: 2.7 g/dL (ref 1.5–4.5)
Glucose: 89 mg/dL (ref 65–99)
Potassium: 4.5 mmol/L (ref 3.5–5.2)
Sodium: 137 mmol/L (ref 134–144)
Total Protein: 7.2 g/dL (ref 6.0–8.5)

## 2019-07-02 LAB — CBC WITH DIFFERENTIAL/PLATELET
Basophils Absolute: 0.1 10*3/uL (ref 0.0–0.2)
Basos: 2 %
EOS (ABSOLUTE): 0.2 10*3/uL (ref 0.0–0.4)
Eos: 3 %
Hematocrit: 42.8 % (ref 37.5–51.0)
Hemoglobin: 14.8 g/dL (ref 13.0–17.7)
Immature Grans (Abs): 0 10*3/uL (ref 0.0–0.1)
Immature Granulocytes: 0 %
Lymphocytes Absolute: 2.2 10*3/uL (ref 0.7–3.1)
Lymphs: 36 %
MCH: 28.8 pg (ref 26.6–33.0)
MCHC: 34.6 g/dL (ref 31.5–35.7)
MCV: 83 fL (ref 79–97)
Monocytes Absolute: 0.5 10*3/uL (ref 0.1–0.9)
Monocytes: 9 %
Neutrophils Absolute: 3 10*3/uL (ref 1.4–7.0)
Neutrophils: 50 %
Platelets: 384 10*3/uL (ref 150–450)
RBC: 5.13 x10E6/uL (ref 4.14–5.80)
RDW: 13.4 % (ref 11.6–15.4)
WBC: 6 10*3/uL (ref 3.4–10.8)

## 2019-07-02 LAB — LIPID PANEL
Chol/HDL Ratio: 3.7 ratio (ref 0.0–5.0)
Cholesterol, Total: 172 mg/dL (ref 100–199)
HDL: 47 mg/dL (ref >39)
LDL Chol Calc (NIH): 96 mg/dL (ref 0–99)
Triglycerides: 170 mg/dL — ABNORMAL HIGH (ref 0–149)
VLDL Cholesterol Cal: 29 mg/dL (ref 5–40)

## 2019-07-02 LAB — HEMOGLOBIN A1C
Est. average glucose Bld gHb Est-mCnc: 111 mg/dL
Hgb A1c MFr Bld: 5.5 % (ref 4.8–5.6)

## 2019-07-02 LAB — TSH: TSH: 0.925 u[IU]/mL (ref 0.450–4.500)

## 2019-07-02 NOTE — Progress Notes (Signed)
New Patient Office Visit  Subjective:  Patient ID: Christian Tanner, male    DOB: 02-Jul-1990  Age: 29 y.o. MRN: [id number]  CC:  Chief Complaint  Patient presents with  . New Patient (Initial Visit)    Establish Care and physical    HPI Christian Tanner presents for visit to establish care and CPE.  No major medical history noted. Only surgical history is appendectomy.  No current medications, no known allergies.  Wears corrective lenses, seeing optometry regularly. Due for dental cleaning and checkup - last visit was for broken and infected tooth, which was removed.  No past medical history on file.  Past Surgical History:  Procedure Laterality Date  . APPENDECTOMY      Family History  Problem Relation Age of Onset  . Healthy Mother   . Gallstones Mother   . Hyperlipidemia Father   . Hypertension Father   . Healthy Brother   . Healthy Daughter   . Healthy Son     Social History   Socioeconomic History  . Marital status: Single    Spouse name: Not on file  . Number of children: Not on file  . Years of education: Not on file  . Highest education level: Not on file  Occupational History  . Not on file  Tobacco Use  . Smoking status: Current Every Day Smoker  . Smokeless tobacco: Current User    Types: Chew  Substance and Sexual Activity  . Alcohol use: No  . Drug use: No  . Sexual activity: Not on file  Other Topics Concern  . Not on file  Social History Narrative   ** Merged History Encounter **       ** Merged History Encounter **       Social Determinants of Health   Financial Resource Strain:   . Difficulty of Paying Living Expenses:   Food Insecurity:   . Worried About Programme researcher, broadcasting/film/video in the Last Year:   . Barista in the Last Year:   Transportation Needs:   . Freight forwarder (Medical):   Marland Kitchen Lack of Transportation (Non-Medical):   Physical Activity:   . Days of Exercise per Week:   . Minutes of Exercise per Session:   Stress:    . Feeling of Stress :   Social Connections:   . Frequency of Communication with Friends and Family:   . Frequency of Social Gatherings with Friends and Family:   . Attends Religious Services:   . Active Member of Clubs or Organizations:   . Attends Banker Meetings:   Marland Kitchen Marital Status:   Intimate Partner Violence:   . Fear of Current or Ex-Partner:   . Emotionally Abused:   Marland Kitchen Physically Abused:   . Sexually Abused:     ROS Review of Systems  Constitutional: Negative.   HENT: Negative.   Eyes: Negative.   Respiratory: Negative.   Cardiovascular: Negative.   Gastrointestinal: Negative.   Endocrine: Negative.   Genitourinary: Negative.   Musculoskeletal: Negative.   Skin: Negative.   Allergic/Immunologic: Negative.   Neurological: Negative.   Hematological: Negative.   Psychiatric/Behavioral: Negative.   All other systems reviewed and are negative.   Objective:   Today's Vitals: BP 122/65   Pulse 78   Temp 97.9 F (36.6 C) (Temporal)   Resp 17   Ht 5\' 10"  (1.778 m)   Wt 195 lb (88.5 kg)   SpO2 97%   BMI 27.98 kg/m  Physical Exam Vitals and nursing note reviewed.  Constitutional:      General: He is not in acute distress.    Appearance: Normal appearance. He is normal weight. He is not ill-appearing, toxic-appearing or diaphoretic.  HENT:     Head: Normocephalic and atraumatic.     Right Ear: Tympanic membrane, ear canal and external ear normal. There is no impacted cerumen.     Left Ear: Tympanic membrane, ear canal and external ear normal. There is no impacted cerumen.     Nose: Nose normal. No congestion or rhinorrhea.     Mouth/Throat:     Mouth: Mucous membranes are moist.     Pharynx: Oropharynx is clear. No oropharyngeal exudate or posterior oropharyngeal erythema.  Eyes:     General: No scleral icterus.       Right eye: No discharge.        Left eye: No discharge.     Extraocular Movements: Extraocular movements intact.      Conjunctiva/sclera: Conjunctivae normal.     Pupils: Pupils are equal, round, and reactive to light.  Neck:     Vascular: No carotid bruit.  Cardiovascular:     Rate and Rhythm: Normal rate and regular rhythm.     Pulses: Normal pulses.     Heart sounds: Normal heart sounds. No murmur. No friction rub. No gallop.   Pulmonary:     Effort: Pulmonary effort is normal. No respiratory distress.     Breath sounds: Normal breath sounds. No stridor. No wheezing, rhonchi or rales.  Chest:     Chest wall: No tenderness.  Abdominal:     General: Abdomen is flat. Bowel sounds are normal. There is no distension.     Palpations: Abdomen is soft. There is no mass.     Tenderness: There is no abdominal tenderness. There is no right CVA tenderness, left CVA tenderness, guarding or rebound.     Hernia: No hernia is present.  Musculoskeletal:        General: No swelling, tenderness, deformity or signs of injury. Normal range of motion.     Cervical back: Normal range of motion and neck supple. No rigidity or tenderness.     Right lower leg: No edema.     Left lower leg: No edema.  Lymphadenopathy:     Cervical: No cervical adenopathy.  Skin:    General: Skin is warm and dry.     Capillary Refill: Capillary refill takes less than 2 seconds.     Coloration: Skin is not jaundiced or pale.     Findings: No bruising, erythema, lesion or rash.  Neurological:     General: No focal deficit present.     Mental Status: He is alert and oriented to person, place, and time. Mental status is at baseline.     Cranial Nerves: No cranial nerve deficit.     Sensory: No sensory deficit.     Motor: No weakness.     Coordination: Coordination normal.     Gait: Gait normal.     Deep Tendon Reflexes: Reflexes normal.  Psychiatric:        Mood and Affect: Mood normal.        Behavior: Behavior normal.        Thought Content: Thought content normal.        Judgment: Judgment normal.     Assessment & Plan:    Problem List Items Addressed This Visit    None    Visit Diagnoses  Screening for endocrine, metabolic and immunity disorder    -  Primary   Relevant Orders   Comprehensive metabolic panel (Completed)   CBC with Differential (Completed)   TSH (Completed)   Hemoglobin A1c (Completed)   Lipid screening       Relevant Orders   Lipid panel (Completed)      Outpatient Encounter Medications as of 07/01/2019  Medication Sig  . benzonatate (TESSALON) 100 MG capsule Take 1 capsule (100 mg total) by mouth every 8 (eight) hours. (Patient not taking: Reported on 07/01/2019)  . cephALEXin (KEFLEX) 500 MG capsule Take 1 capsule (500 mg total) by mouth 4 (four) times daily. (Patient not taking: Reported on 07/01/2019)  . DiphenhydrAMINE HCl (BENADRYL PO) Take 1 tablet by mouth every 4 (four) hours as needed. For itching  . naproxen (NAPROSYN) 500 MG tablet Take 1 tablet (500 mg total) by mouth 2 (two) times daily with a meal. (Patient not taking: Reported on 07/01/2019)  . penicillin v potassium (VEETID) 500 MG tablet Take 1 tablet (500 mg total) by mouth 4 (four) times daily. (Patient not taking: Reported on 07/01/2019)  . predniSONE (DELTASONE) 10 MG tablet Take 2 tablets (20 mg total) by mouth 2 (two) times daily. (Patient not taking: Reported on 07/01/2019)   No facility-administered encounter medications on file as of 07/01/2019.    Follow-up: No follow-ups on file.   PLAN  Exam unremarkable  Labs collected, will follow up as warranted  If labs are normal follow up in 1 year for cPE and labs  Patient encouraged to call clinic with any questions, comments, or concerns.  Maximiano Coss, NP

## 2019-08-01 ENCOUNTER — Emergency Department (HOSPITAL_COMMUNITY)
Admission: EM | Admit: 2019-08-01 | Discharge: 2019-08-01 | Disposition: A | Discharge status: Home / Self Care | Payer: 59 | Attending: Emergency Medicine | Admitting: Emergency Medicine

## 2019-08-01 ENCOUNTER — Other Ambulatory Visit: Payer: Self-pay

## 2019-08-01 DIAGNOSIS — S0990XA Unspecified injury of head, initial encounter: Secondary | ICD-10-CM | POA: Diagnosis present

## 2019-08-01 DIAGNOSIS — Y999 Unspecified external cause status: Secondary | ICD-10-CM | POA: Insufficient documentation

## 2019-08-01 DIAGNOSIS — Y929 Unspecified place or not applicable: Secondary | ICD-10-CM | POA: Diagnosis not present

## 2019-08-01 DIAGNOSIS — W1830XA Fall on same level, unspecified, initial encounter: Secondary | ICD-10-CM | POA: Diagnosis not present

## 2019-08-01 DIAGNOSIS — Z5321 Procedure and treatment not carried out due to patient leaving prior to being seen by health care provider: Secondary | ICD-10-CM | POA: Insufficient documentation

## 2019-08-01 DIAGNOSIS — Y939 Activity, unspecified: Secondary | ICD-10-CM | POA: Diagnosis not present

## 2019-08-01 DIAGNOSIS — S060X0A Concussion without loss of consciousness, initial encounter: Secondary | ICD-10-CM | POA: Insufficient documentation

## 2019-08-01 NOTE — ED Triage Notes (Signed)
Per patient he was playing soccer about 30 minutes ago and hit heads with another player. Patient then fell to ground and hit head on ground. Patient states he believes he has a concussion because he couldn't remember what happened at time of accident. Patient denies n/v. Vision intact. Denies pain

## 2019-08-10 ENCOUNTER — Other Ambulatory Visit: Payer: Self-pay

## 2019-08-10 ENCOUNTER — Ambulatory Visit (INDEPENDENT_AMBULATORY_CARE_PROVIDER_SITE_OTHER): Payer: 59 | Admitting: Registered Nurse

## 2019-08-10 ENCOUNTER — Encounter: Payer: Self-pay | Admitting: Registered Nurse

## 2019-08-10 VITALS — BP 115/71 | HR 80 | Temp 97.5°F | Resp 16 | Ht 71.0 in | Wt 204.2 lb

## 2019-08-10 DIAGNOSIS — S060X0A Concussion without loss of consciousness, initial encounter: Secondary | ICD-10-CM

## 2019-08-10 NOTE — Patient Instructions (Signed)
° ° ° °  If you have lab work done today you will be contacted with your lab results within the next 2 weeks.  If you have not heard from us then please contact us. The fastest way to get your results is to register for My Chart. ° ° °IF you received an x-ray today, you will receive an invoice from [facility]. Please contact [facility] at [phone] with questions or concerns regarding your invoice.  ° °IF you received labwork today, you will receive an invoice from [facility]. Please contact [facility] at [phone] with questions or concerns regarding your invoice.  ° °Our billing staff will not be able to assist you with questions regarding bills from these companies. ° °You will be contacted with the lab results as soon as they are available. The fastest way to get your results is to activate your My Chart account. Instructions are located on the last page of this paperwork. If you have not heard from us regarding the results in 2 weeks, please contact this office. °  ° ° ° °

## 2019-08-10 NOTE — Progress Notes (Signed)
Acute Office Visit  Subjective:    Patient ID: Christian Tanner, male    DOB: [birth year], 29 y.o.   MRN: [id number]  Chief Complaint  Patient presents with  . Concussion    per pt on 07/31/2019 hit head and about 3-4 days after started a headache with blurred vision    HPI Patient is in today for concussion   Hit head on 07/31/19. Had acute headache, some dizziness, confusion, difficulty concentrating. All of these have steadily improved. Taken some tylenol to help with headaches. At this time, no acute neuro deficits of note and no worsening symptoms. Feeling well overall.  No past medical history on file.  Past Surgical History:  Procedure Laterality Date  . APPENDECTOMY      Family History  Problem Relation Age of Onset  . Healthy Mother   . Gallstones Mother   . Hyperlipidemia Father   . Hypertension Father   . Healthy Brother   . Healthy Daughter   . Healthy Son     Social History   Socioeconomic History  . Marital status: Single    Spouse name: Not on file  . Number of children: Not on file  . Years of education: Not on file  . Highest education level: Not on file  Occupational History  . Not on file  Tobacco Use  . Smoking status: Current Every Day Smoker  . Smokeless tobacco: Current User    Types: Chew  Substance and Sexual Activity  . Alcohol use: No  . Drug use: No  . Sexual activity: Not on file  Other Topics Concern  . Not on file  Social History Narrative   ** Merged History Encounter **       ** Merged History Encounter **       Social Determinants of Health   Financial Resource Strain:   . Difficulty of Paying Living Expenses:   Food Insecurity:   . Worried About Programme researcher, broadcasting/film/video in the Last Year:   . Barista in the Last Year:   Transportation Needs:   . Freight forwarder (Medical):   Marland Kitchen Lack of Transportation (Non-Medical):   Physical Activity:   . Days of Exercise per Week:   . Minutes of Exercise per Session:     Stress:   . Feeling of Stress :   Social Connections:   . Frequency of Communication with Friends and Family:   . Frequency of Social Gatherings with Friends and Family:   . Attends Religious Services:   . Active Member of Clubs or Organizations:   . Attends Banker Meetings:   Marland Kitchen Marital Status:   Intimate Partner Violence:   . Fear of Current or Ex-Partner:   . Emotionally Abused:   Marland Kitchen Physically Abused:   . Sexually Abused:     Outpatient Medications Prior to Visit  Medication Sig Dispense Refill  . benzonatate (TESSALON) 100 MG capsule Take 1 capsule (100 mg total) by mouth every 8 (eight) hours. 21 capsule 0  . cephALEXin (KEFLEX) 500 MG capsule Take 1 capsule (500 mg total) by mouth 4 (four) times daily. 20 capsule 0  . DiphenhydrAMINE HCl (BENADRYL PO) Take 1 tablet by mouth every 4 (four) hours as needed. For itching    . naproxen (NAPROSYN) 500 MG tablet Take 1 tablet (500 mg total) by mouth 2 (two) times daily with a meal. 30 tablet 0  . penicillin v potassium (VEETID) 500 MG tablet Take 1  tablet (500 mg total) by mouth 4 (four) times daily. 40 tablet 0  . predniSONE (DELTASONE) 10 MG tablet Take 2 tablets (20 mg total) by mouth 2 (two) times daily. 10 tablet 0   No facility-administered medications prior to visit.    No Known Allergies  Review of Systems  Constitutional: Negative.   HENT: Negative.   Eyes: Negative.   Respiratory: Negative.   Cardiovascular: Negative.   Gastrointestinal: Negative.   Endocrine: Negative.   Genitourinary: Negative.   Musculoskeletal: Negative.   Skin: Negative.   Allergic/Immunologic: Negative.   Neurological: Positive for dizziness (mild, improving) and headaches (mild, improving). Negative for tremors, seizures, syncope, facial asymmetry, speech difficulty, weakness, light-headedness and numbness.  Hematological: Negative.   Psychiatric/Behavioral: Negative.   All other systems reviewed and are negative.       Objective:    Physical Exam  BP 115/71   Pulse 80   Temp (!) 97.5 F (36.4 C) (Temporal)   Resp 16   Ht 5\' 11"  (1.803 m)   Wt 204 lb 3.2 oz (92.6 kg)   SpO2 97%   BMI 28.48 kg/m  Wt Readings from Last 3 Encounters:  08/10/19 204 lb 3.2 oz (92.6 kg)  07/01/19 195 lb (88.5 kg)  01/25/15 195 lb (88.5 kg)    Health Maintenance Due  Topic Date Due  . Hepatitis C Screening  Never done    There are no preventive care reminders to display for this patient.   Lab Results  Component Value Date   TSH 0.925 07/01/2019   Lab Results  Component Value Date   WBC 6.0 07/01/2019   HGB 14.8 07/01/2019   HCT 42.8 07/01/2019   MCV 83 07/01/2019   PLT 384 07/01/2019   Lab Results  Component Value Date   NA 137 07/01/2019   K 4.5 07/01/2019   CO2 23 07/01/2019   GLUCOSE 89 07/01/2019   BUN 9 07/01/2019   CREATININE 1.00 07/01/2019   BILITOT <0.2 07/01/2019   ALKPHOS 100 07/01/2019   AST 17 07/01/2019   ALT 13 07/01/2019   PROT 7.2 07/01/2019   ALBUMIN 4.5 07/01/2019   CALCIUM 10.0 07/01/2019   Lab Results  Component Value Date   CHOL 172 07/01/2019   Lab Results  Component Value Date   HDL 47 07/01/2019   Lab Results  Component Value Date   LDLCALC 96 07/01/2019   Lab Results  Component Value Date   TRIG 170 (H) 07/01/2019   Lab Results  Component Value Date   CHOLHDL 3.7 07/01/2019   Lab Results  Component Value Date   HGBA1C 5.5 07/01/2019       Assessment & Plan:   Problem List Items Addressed This Visit    None    Visit Diagnoses    Concussion without loss of consciousness, initial encounter    -  Primary       No orders of the defined types were placed in this encounter.  PLAN  Continue supportive care and brain rest  No imaging warranted  Discussed reasons to return  Patient encouraged to call clinic with any questions, comments, or concerns.   Maximiano Coss, NP

## 2019-08-17 IMAGING — CR LEFT FOOT - COMPLETE 3+ VIEW
3 series · 3 of 3 positions shown · non-contrast
Comparison: None.

CLINICAL DATA: Patient stepped on a screw.  Midfoot puncture.

EXAM:
LEFT FOOT - COMPLETE 3+ VIEW

[foot ap]
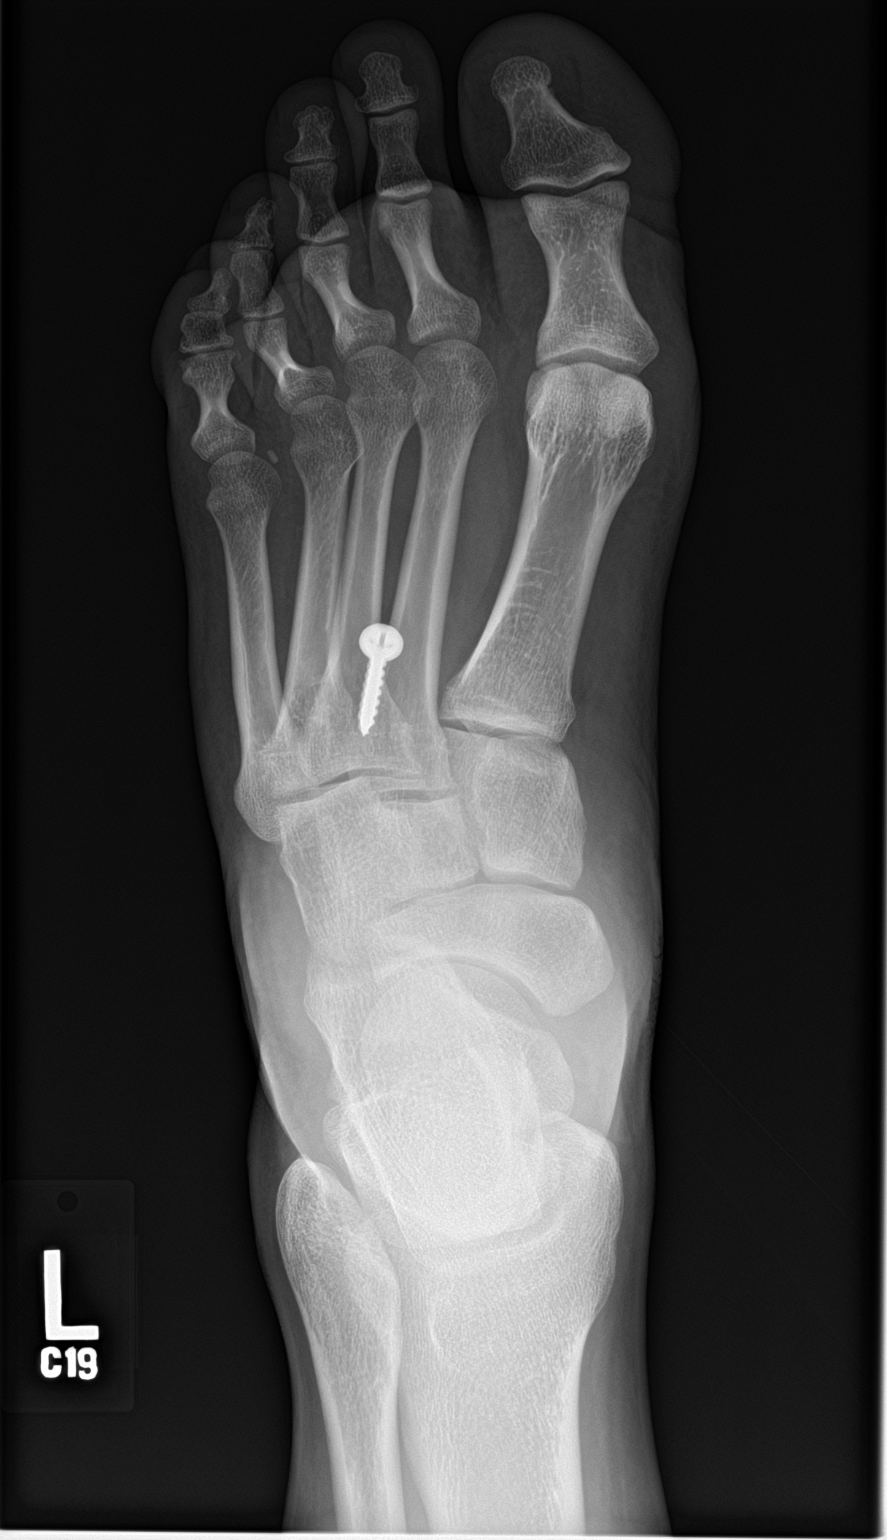

[foot obl]
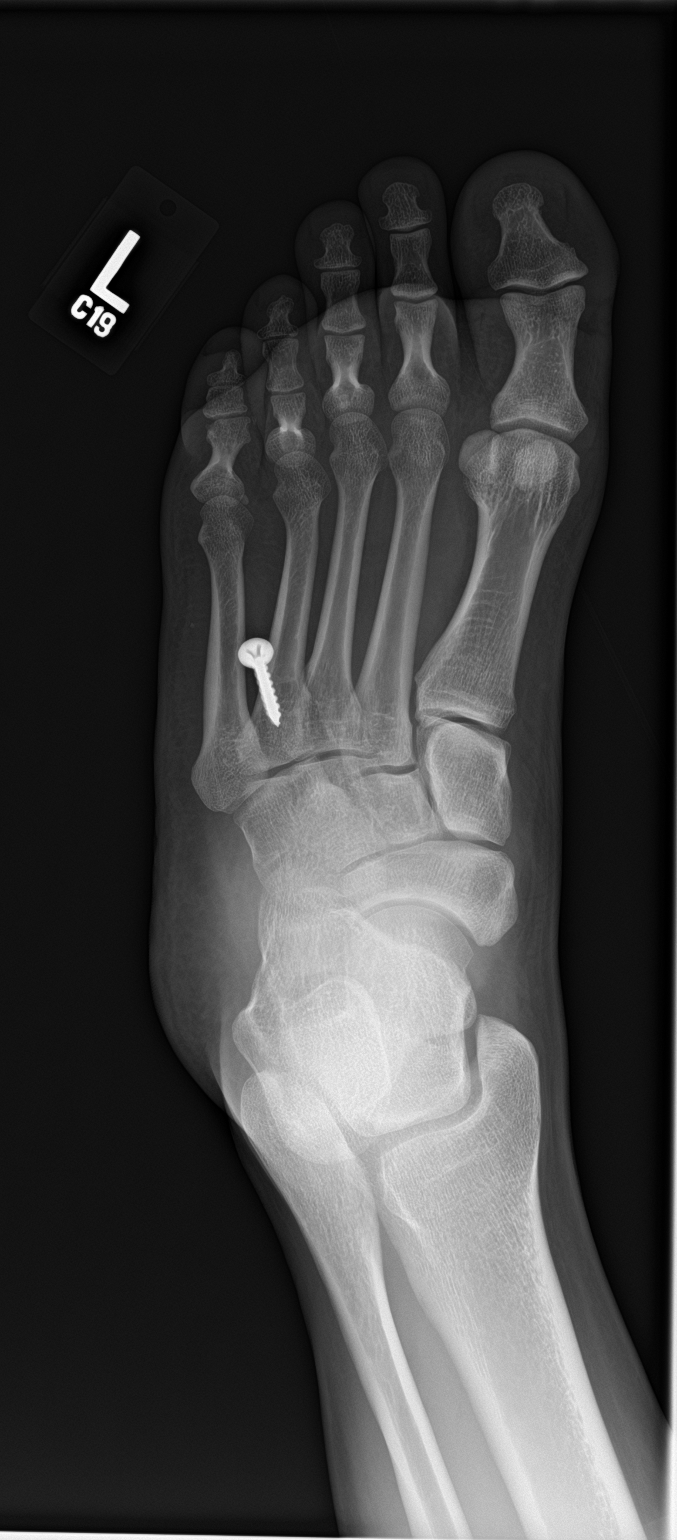

[foot lat]
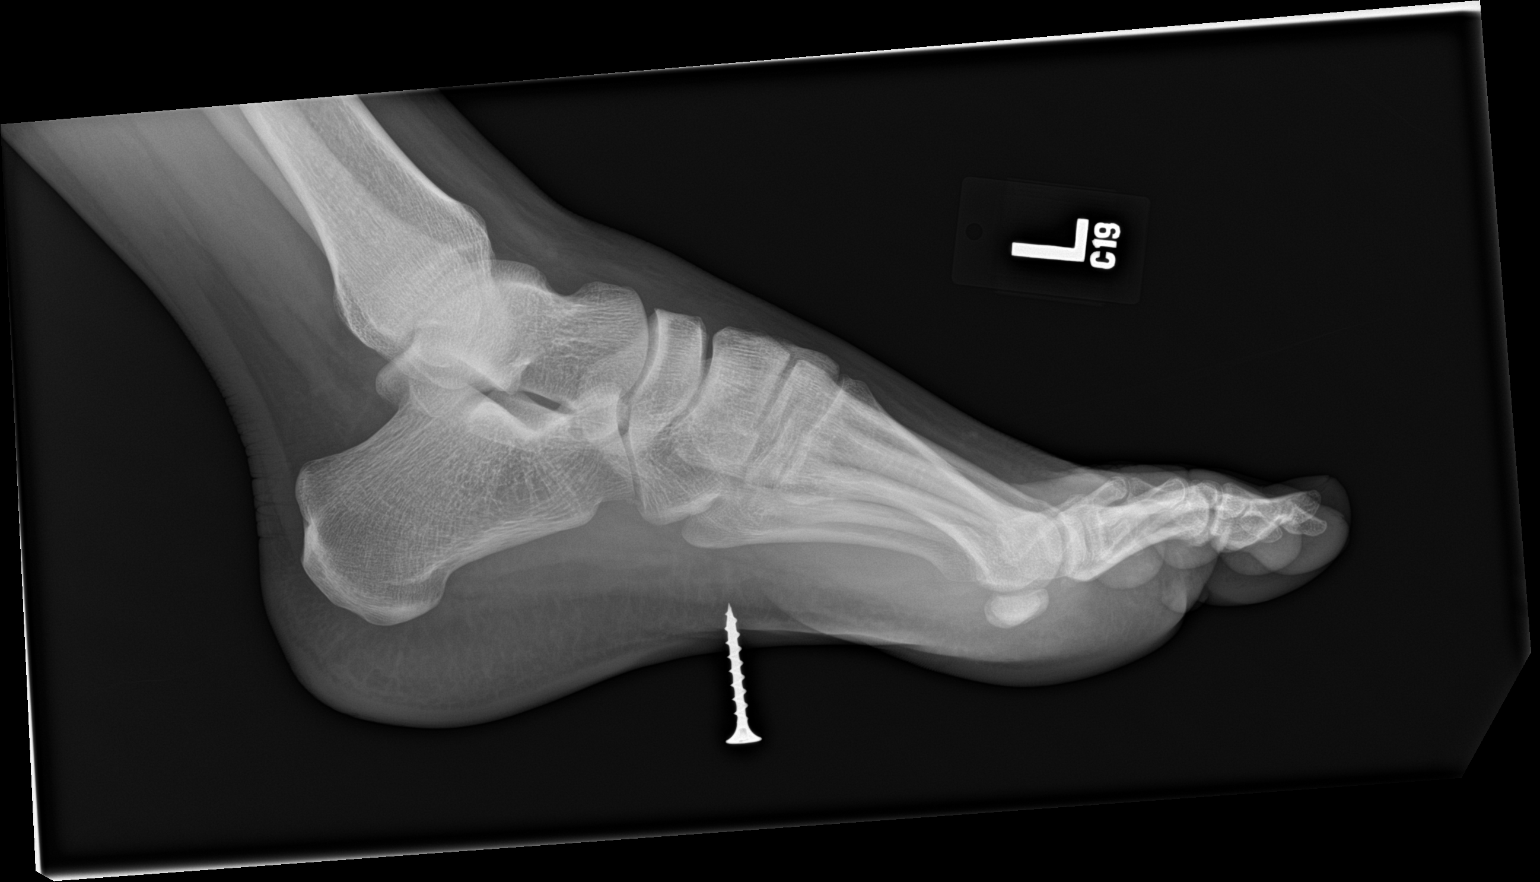

[3 of 3 positions shown; findings below may reference images not displayed]

FINDINGS: Intact screw extends into the superficial soft tissues of the mid
plantar foot.

No fracture or bone lesion.

Joints are normally spaced and aligned.  No arthropathic changes.

No other soft tissue abnormality.
IMPRESSION: Intact screw within the midfoot plantar soft tissues. No fracture or
dislocation.

## 2023-12-06 ENCOUNTER — Emergency Department (HOSPITAL_COMMUNITY)
Admission: EM | Admit: 2023-12-06 | Discharge: 2023-12-06 | Disposition: A | Discharge status: Home / Self Care | Attending: Emergency Medicine | Admitting: Emergency Medicine

## 2023-12-06 ENCOUNTER — Encounter (HOSPITAL_COMMUNITY): Payer: Self-pay | Admitting: Emergency Medicine

## 2023-12-06 ENCOUNTER — Other Ambulatory Visit: Payer: Self-pay

## 2023-12-06 DIAGNOSIS — J020 Streptococcal pharyngitis: Secondary | ICD-10-CM | POA: Diagnosis not present

## 2023-12-06 DIAGNOSIS — J029 Acute pharyngitis, unspecified: Secondary | ICD-10-CM | POA: Diagnosis present

## 2023-12-06 LAB — GROUP A STREP BY PCR: Group A Strep by PCR: DETECTED — AB

## 2023-12-06 MED ORDER — AMOXICILLIN 500 MG PO CAPS: 500.0000 mg | ORAL_CAPSULE | Freq: Two times a day (BID) | ORAL | 0 refills | Status: AC

## 2023-12-06 MED ORDER — LIDOCAINE VISCOUS HCL 2 % MT SOLN
15.0000 mL | Freq: Once | OROMUCOSAL | Status: AC
  Administered 2023-12-06: 15 mL via OROMUCOSAL
  Filled 2023-12-06: qty 15

## 2023-12-06 MED ORDER — AMOXICILLIN 500 MG PO CAPS
500.0000 mg | ORAL_CAPSULE | Freq: Once | ORAL | Status: AC
  Administered 2023-12-06: 500 mg via ORAL
  Filled 2023-12-06: qty 1

## 2023-12-06 MED ORDER — DEXAMETHASONE SOD PHOSPHATE PF 10 MG/ML IJ SOLN
10.0000 mg | Freq: Once | INTRAMUSCULAR | Status: AC
  Administered 2023-12-06: 10 mg via INTRAMUSCULAR

## 2023-12-06 NOTE — Discharge Instructions (Signed)
 Thank you for letting us  evaluate you today. You tested positive for strep. We have given you an antibiotic in ED as well as a prescription. Pick this up and start tomorrow. Take as prescribed and do not miss a dose. You should not have remaining antibiotics. Throw away your toothbrush in 3 days to avoid recontaminating yourself. Avoid sharing drinks with other people as you may spread it. Use hot tea, honey, over the counter chloraseptic spray for throat pain  Return to ED if you experience throat closing sensation, inability to swallow liquids, worsening symptoms

## 2023-12-06 NOTE — ED Triage Notes (Signed)
 Patient c/o sore hroat x 2 days. Patient denies fever. Patient denies N/V.

## 2023-12-06 NOTE — ED Provider Notes (Signed)
 [place] EMERGENCY DEPARTMENT AT [facility] Provider Note   CSN: [id number] Arrival date & time: 12/06/23  1353     Patient presents with: Sore Throat   Christian Tanner is a 33 y.o. male with a past medical history of appendectomy presents Emergency Department for evaluation of sore throat over the past 2 days.  Has been drinking water appropriately. Has not tried any OTC medications for symptoms at home. Denies cough, fever, hoarse voice, difficulty swallowing     Sore Throat       Prior to Admission medications   Medication Sig Start Date End Date Taking? Authorizing Provider  amoxicillin (AMOXIL) 500 MG capsule Take 1 capsule (500 mg total) by mouth 2 (two) times daily for 10 days. 12/06/23 12/16/23 Yes Minnie Tinnie BRAVO, PA    Allergies: Patient has no known allergies.    Review of Systems  HENT:  Positive for sore throat. Negative for trouble swallowing and voice change.     Updated Vital Signs BP (!) 144/80 (BP Location: Right Arm)   Pulse 100   Temp 98.3 F (36.8 C) (Oral)   Resp 18   Ht 5' 10 (1.778 m)   Wt 95.3 kg   SpO2 100%   BMI 30.13 kg/m   Physical Exam Vitals and nursing note reviewed.  Constitutional:      General: He is not in acute distress.    Appearance: Normal appearance. He is not ill-appearing.  HENT:     Head: Normocephalic and atraumatic.     Comments: No submandibular swelling or tenderness.  Maintaining secretions without difficulty.  Swallows without difficulty.  No drooling.    Mouth/Throat:     Mouth: Mucous membranes are moist. No oral lesions.     Pharynx: Uvula midline. Posterior oropharyngeal erythema present. No oropharyngeal exudate or uvula swelling.     Tonsils: Tonsillar exudate present. No tonsillar abscesses. 2+ on the right. 2+ on the left.  Eyes:     Conjunctiva/sclera: Conjunctivae normal.  Cardiovascular:     Rate and Rhythm: Normal rate.  Pulmonary:     Effort: Pulmonary effort is normal. No  respiratory distress.  Skin:    Coloration: Skin is not jaundiced or pale.  Neurological:     Mental Status: He is alert. Mental status is at baseline.     (all labs ordered are listed, but only abnormal results are displayed) Labs Reviewed  GROUP A STREP BY PCR - Abnormal; Notable for the following components:      Result Value   Group A Strep by PCR DETECTED (*)    All other components within normal limits    EKG: None  Radiology: No results found.   Medications Ordered in the ED  amoxicillin (AMOXIL) capsule 500 mg (500 mg Oral Given 12/06/23 1711)  lidocaine  (XYLOCAINE ) 2 % viscous mouth solution 15 mL (15 mLs Mouth/Throat Given 12/06/23 1711)  dexamethasone (DECADRON) injection 10 mg (10 mg Intramuscular Given 12/06/23 1712)                                    Medical Decision Making Risk Prescription drug management.   Patient presents to the ED for concern of sore throat, this involves an extensive number of treatment options, and is a complaint that carries with it a high risk of complications and morbidity.  The differential diagnosis includes viral pharyngitis, strep pharyngitis, viral URI, Ludwig angina, tonsillar  abscess, retropharyngeal abscess   Co morbidities that complicate the patient evaluation  None   Additional history obtained:  Additional history obtained from Nursing   External records from outside source obtained and reviewed including triage note   Lab Tests:  I Ordered, and personally interpreted labs.  The pertinent results include:   Positive for strep     Medicines ordered and prescription drug management:  I ordered medication including decadron, amox  for strep pharyngitis  Reevaluation of the patient after these medicines showed that the patient improved I have reviewed the patients home medicines and have made adjustments as needed     Problem List / ED Course:  Strep pharyngitis Mild tachycardia of 106 bpm.  No  fever.  Tachycardia resolved to 100 prior to discharge Posterior oropharyngeal erythema, exudates.  No signs of tonsillar abscess. No submandibular swelling or tenderness.  Low suspicion for retropharyngeal abscess, Ludwig angina Maintaining oxygen saturation without supplementation.  Lung sound CTAB.  Low suspicion for pneumonia No hoarse voice, hot potato voice, difficulty swallowing.  Maintaining secretions. Provided 1 dose of Decadron here Emergency Department for sore throat and amoxicillin.  10 amoxicillin prescription to pharmacy Discussed antibiotic stewardship Discussed symptomatic treatment at home Provided PCPs patient did not have 1   Reevaluation:  After the interventions noted above, I reevaluated the patient and found that they have :improved   Dispostion:  After consideration of the diagnostic results and the patients response to treatment, I feel that the patent would benefit from outpatient management with amoxicillin for strep pharyngitis.   Discussed ED workup, disposition, return to ED precautions with patient who expresses understanding agrees with plan.  All questions answered to their satisfaction.  They are agreeable to plan.  Discharge instructions provided on paperwork  Final diagnoses:  Strep pharyngitis    ED Discharge Orders          Ordered    amoxicillin (AMOXIL) 500 MG capsule  2 times daily        12/06/23 1625             Minnie Tinnie BRAVO, PA 12/07/23 2205    Gennaro, Megan L, DO 12/09/23 1006
